# Patient Record
Sex: Male | Born: 1980 | Race: Black or African American | Hispanic: No | Marital: Single | Smoking: Never smoker
Health system: Southern US, Community
[De-identification: ages and names within clinical notes are randomized; demographics above are authoritative.]

## PROBLEM LIST (undated history)

## (undated) DIAGNOSIS — H5210 Myopia, unspecified eye: Secondary | ICD-10-CM

## (undated) DIAGNOSIS — E041 Nontoxic single thyroid nodule: Secondary | ICD-10-CM

## (undated) HISTORY — DX: Nontoxic single thyroid nodule: E04.1

## (undated) HISTORY — DX: Myopia, unspecified eye: H52.10

---

## 2008-12-19 HISTORY — PX: BURN TREATMENT: SHX158

## 2010-12-19 DIAGNOSIS — E041 Nontoxic single thyroid nodule: Secondary | ICD-10-CM

## 2010-12-19 HISTORY — DX: Nontoxic single thyroid nodule: E04.1

## 2011-04-12 ENCOUNTER — Emergency Department (HOSPITAL_COMMUNITY)
Admission: EM | Admit: 2011-04-12 | Discharge: 2011-04-13 | Disposition: A | Discharge status: Home / Self Care | Payer: Worker's Compensation | Attending: Emergency Medicine | Admitting: Emergency Medicine

## 2011-04-12 DIAGNOSIS — T24019A Burn of unspecified degree of unspecified thigh, initial encounter: Secondary | ICD-10-CM | POA: Insufficient documentation

## 2011-04-12 DIAGNOSIS — R609 Edema, unspecified: Secondary | ICD-10-CM | POA: Insufficient documentation

## 2011-04-12 DIAGNOSIS — IMO0002 Reserved for concepts with insufficient information to code with codable children: Secondary | ICD-10-CM | POA: Insufficient documentation

## 2011-04-12 DIAGNOSIS — Y929 Unspecified place or not applicable: Secondary | ICD-10-CM | POA: Insufficient documentation

## 2011-04-12 DIAGNOSIS — T24039A Burn of unspecified degree of unspecified lower leg, initial encounter: Secondary | ICD-10-CM | POA: Insufficient documentation

## 2011-04-12 DIAGNOSIS — M79609 Pain in unspecified limb: Secondary | ICD-10-CM | POA: Insufficient documentation

## 2011-04-12 DIAGNOSIS — M7989 Other specified soft tissue disorders: Secondary | ICD-10-CM | POA: Insufficient documentation

## 2011-08-05 ENCOUNTER — Encounter: Payer: Self-pay | Admitting: Medical

## 2011-10-14 ENCOUNTER — Encounter: Payer: Self-pay | Admitting: Medical

## 2011-10-14 ENCOUNTER — Ambulatory Visit (INDEPENDENT_AMBULATORY_CARE_PROVIDER_SITE_OTHER): Payer: BC Managed Care – PPO | Admitting: Medical

## 2011-10-14 ENCOUNTER — Other Ambulatory Visit: Payer: Self-pay | Admitting: Medical

## 2011-10-14 DIAGNOSIS — E041 Nontoxic single thyroid nodule: Secondary | ICD-10-CM

## 2011-10-14 DIAGNOSIS — J309 Allergic rhinitis, unspecified: Secondary | ICD-10-CM | POA: Insufficient documentation

## 2011-10-14 DIAGNOSIS — R030 Elevated blood-pressure reading, without diagnosis of hypertension: Secondary | ICD-10-CM | POA: Insufficient documentation

## 2011-10-14 DIAGNOSIS — S56911A Strain of unspecified muscles, fascia and tendons at forearm level, right arm, initial encounter: Secondary | ICD-10-CM | POA: Insufficient documentation

## 2011-10-14 DIAGNOSIS — Z0000 Encounter for general adult medical examination without abnormal findings: Secondary | ICD-10-CM
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
## 2011-10-14 NOTE — Patient Instructions (Signed)
Abnormal findings today:  You appear to have a 5mm diameter nodule on your right thyroid.   This could be a number of thing, but normally we examine this further with ultrasound and lab work.  I recommend we go ahead and get a thyroid ultrasound.    Your nasal exam suggests swelling and clear discharge from allergens.  I want you to begin OTC Zyrtec 10mg , 1 tablet daily at bedtime for nasal swelling and congestion. If this is not helping after 2-3 weeks, we can add a prescription nasal spray.   I would like you to return fasting (nothing to eat after midnight) for labs one morning next week.  Please let the front office know which day you will be coming in.   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  I recommend a few days of rest if possible.  I recommend applying ice for 20 minutes, followed by heat for 20 minutes once to twice daily for 1- 2 weeks.   You can use OTC Aleve once to twice daily for pain and inflammation.   You can also purchase a "tennis elbow" band from the pharmacy that you can wear over the forearm just before it meets the elbow.  This can give comfort.   If not improving in 2-3 weeks, let me know.   Overall, you appear healthy.     Preventative Care for Adults, Male       REGULAR HEALTH EXAMS:  A routine yearly physical is a good way to check in with your primary care provider about your health and preventive screening. It is also an opportunity to share updates about your health and any concerns you have, and receive a thorough all-over exam.   Most health insurance companies pay for at least some preventative services.  Check with your health plan for specific coverages.  WHAT PREVENTATIVE SERVICES DO MEN NEED?  Adult men should have their weight and blood pressure checked regularly.   Men age 60 and older should have their cholesterol levels checked regularly.  Beginning at age 83 and continuing to  age 72, men should be screened for colorectal cancer.  Certain people should may need continued testing until age 38.  Other cancer screening may include exams for testicular and prostate cancer.  Updating vaccinations is part of preventative care.  Vaccinations help protect against diseases such as the flu.  Lab tests are generally done as part of preventative care to screen for anemia and blood disorders, to screen for problems with the kidneys and liver, to screen for bladder problems, to check blood sugar, and to check your cholesterol level.  Preventative services generally include counseling about diet, exercise, avoiding tobacco, drugs, excessive alcohol consumption, and sexually transmitted infections.    GENERAL RECOMMENDATIONS FOR GOOD HEALTH:  Healthy diet:  Eat a variety of foods, including fruit, vegetables, animal or vegetable protein, such as meat, fish, chicken, and eggs, or beans, lentils, tofu, and grains, such as rice.  Drink plenty of water daily.  Decrease saturated fat in the diet, avoid lots of red meat, processed foods, sweets, fast foods, and fried foods.  Exercise:  Aerobic exercise helps maintain good heart health. At least 30-40 minutes of moderate-intensity exercise is recommended. For example, a brisk walk that increases your heart rate and breathing. This should be done on most days of the week.   Find a type of exercise or a variety  of exercises that you enjoy so that it becomes a part of your daily life.  Examples are running, walking, swimming, water aerobics, and biking.  For motivation and support, explore group exercise such as aerobic class, spin class, Zumba, Yoga,or  martial arts, etc.    Set exercise goals for yourself, such as a certain weight goal, walk or run in a race such as a 5k walk/run.  Speak to your primary care provider about exercise goals.  Disease prevention:  If you smoke or chew tobacco, find out from your caregiver how to quit.  It can literally save your life, no matter how long you have been a tobacco user. If you do not use tobacco, never begin.   Maintain a healthy diet and normal weight. Increased weight leads to problems with blood pressure and diabetes.   The Body Mass Index or BMI is a way of measuring how much of your body is fat. Having a BMI above 27 increases the risk of heart disease, diabetes, hypertension, stroke and other problems related to obesity. Your caregiver can help determine your BMI and based on it develop an exercise and dietary program to help you achieve or maintain this important measurement at a healthful level.  High blood pressure causes heart and blood vessel problems.  Persistent high blood pressure should be treated with medicine if weight loss and exercise do not work.   Fat and cholesterol leaves deposits in your arteries that can block them. This causes heart disease and vessel disease elsewhere in your body.  If your cholesterol is found to be high, or if you have heart disease or certain other medical conditions, then you may need to have your cholesterol monitored frequently and be treated with medication.   Ask if you should have a stress test if your history suggests this. A stress test is a test done on a treadmill that looks for heart disease. This test can find disease prior to there being a problem.  Avoid drinking alcohol in excess (more than two drinks per day).  Avoid use of street drugs. Do not share needles with anyone. Ask for professional help if you need assistance or instructions on stopping the use of alcohol, cigarettes, and/or drugs.  Brush your teeth twice a day with fluoride toothpaste, and floss once a day. Good oral hygiene prevents tooth decay and gum disease. The problems can be painful, unattractive, and can cause other health problems. Visit your dentist for a routine oral and dental check up and preventive care every 6-12 months.   Look at your skin  regularly.  Use a mirror to look at your back. Notify your caregivers of changes in moles, especially if there are changes in shapes, colors, a size larger than a pencil eraser, an irregular border, or development of new moles.  Safety:  Use seatbelts 100% of the time, whether driving or as a passenger.  Use safety devices such as hearing protection if you work in environments with loud noise or significant background noise.  Use safety glasses when doing any work that could send debris in to the eyes.  Use a helmet if you ride a bike or motorcycle.  Use appropriate safety gear for contact sports.  Talk to your caregiver about gun safety.  Use sunscreen with a SPF (or skin protection factor) of 15 or greater.  Lighter skinned people are at a greater risk of skin cancer. Don't forget to also wear sunglasses in order to protect your eyes  from too much damaging sunlight. Damaging sunlight can accelerate cataract formation.   Practice safe sex. Use condoms. Condoms are used for birth control and to help reduce the spread of sexually transmitted infections (or STIs).  Some of the STIs are gonorrhea (the clap), chlamydia, syphilis, trichomonas, herpes, HPV (human papilloma virus) and HIV (human immunodeficiency virus) which causes AIDS. The herpes, HIV and HPV are viral illnesses that have no cure. These can result in disability, cancer and death.   Keep carbon monoxide and smoke detectors in your home functioning at all times. Change the batteries every 6 months or use a model that plugs into the wall.   Vaccinations:  Stay up to date with your tetanus shots and other required immunizations. You should have a booster for tetanus every 10 years. Be sure to get your flu shot every year, since 5%-20% of the [place] population comes down with the flu. The flu vaccine changes each year, so being vaccinated once is not enough. Get your shot in the fall, before the flu season peaks.   Other vaccines to  consider:  Pneumococcal vaccine to protect against certain types of pneumonia.  This is normally recommended for adults age 79 or older.  However, adults younger than 28 years old with certain underlying conditions such as diabetes, heart or lung disease should also receive the vaccine.  Shingles vaccine to protect against Varicella Zoster if you are older than age 4, or younger than 28 years old with certain underlying illness.  Hepatitis A vaccine to protect against a form of infection of the liver by a virus acquired from food.  Hepatitis B vaccine to protect against a form of infection of the liver by a virus acquired from blood or body fluids, particularly if you work in health care.  If you plan to travel internationally, check with your local health department for specific vaccination recommendations.  Cancer Screening:  Most routine colon cancer screening begins at the age of 56. On a yearly basis, doctors may provide special easy to use take-home tests to check for hidden blood in the stool. Sigmoidoscopy or colonoscopy can detect the earliest forms of colon cancer and is life saving. These tests use a small camera at the end of a tube to directly examine the colon. Speak to your caregiver about this at age 11, when routine screening begins (and is repeated every 5 years unless early forms of pre-cancerous polyps or small growths are found).   At the age of 14 men usually start screening for prostate cancer every year. Screening may begin at a younger age for those with higher risk. Those at higher risk include African-Americans or having a family history of prostate cancer. There are two types of tests for prostate cancer:   Prostate-specific antigen (PSA) testing. Recent studies raise questions about prostate cancer using PSA and you should discuss this with your caregiver.   Digital rectal exam (in which your doctor's lubricated and gloved finger feels for enlargement of the prostate  through the anus).   Screening for testicular cancer.  Do a monthly exam of your testicles. Gently roll each testicle between your thumb and fingers, feeling for any abnormal lumps. The best time to do this is after a hot shower or bath when the tissues are looser. Notify your caregivers of any lumps, tenderness or changes in size or shape immediately.

## 2011-10-17 ENCOUNTER — Telehealth: Payer: Self-pay | Admitting: Family Medicine

## 2011-10-17 NOTE — Telephone Encounter (Signed)
This patient was referred to [facility] 10/19/11 @ 1045am. CLS

## 2011-10-17 NOTE — Telephone Encounter (Signed)
Message copied by Janeice Robinson on Mon Oct 17, 2011  8:10 AM ------      Message from: [place], [place]      Created: Fri Oct 14, 2011  6:09 PM       Order thyroid ultrasound.

## 2011-10-19 ENCOUNTER — Ambulatory Visit
Admission: RE | Admit: 2011-10-19 | Discharge: 2011-10-19 | Disposition: A | Discharge status: Home / Self Care | Payer: Self-pay | Source: Ambulatory Visit | Attending: Medical | Admitting: Medical

## 2011-10-19 ENCOUNTER — Other Ambulatory Visit: Payer: Self-pay

## 2011-10-19 DIAGNOSIS — E041 Nontoxic single thyroid nodule: Secondary | ICD-10-CM

## 2011-10-20 ENCOUNTER — Other Ambulatory Visit: Payer: Self-pay | Admitting: Medical

## 2011-10-20 DIAGNOSIS — E041 Nontoxic single thyroid nodule: Secondary | ICD-10-CM

## 2011-10-20 LAB — CBC WITH DIFFERENTIAL/PLATELET
Basophils Relative: 1 % (ref 0–1)
Eosinophils Absolute: 0.5 10*3/uL (ref 0.0–0.7)
Eosinophils Relative: 9 % — ABNORMAL HIGH (ref 0–5)
Hemoglobin: 13.7 g/dL (ref 13.0–17.0)
Lymphs Abs: 2.1 10*3/uL (ref 0.7–4.0)
MCH: 30.9 pg (ref 26.0–34.0)
MCHC: 33.9 g/dL (ref 30.0–36.0)
MCV: 91 fL (ref 78.0–100.0)
Monocytes Absolute: 0.2 10*3/uL (ref 0.1–1.0)
Monocytes Relative: 3 % (ref 3–12)
Neutrophils Relative %: 47 % (ref 43–77)
RBC: 4.44 MIL/uL (ref 4.22–5.81)

## 2011-10-20 LAB — COMPREHENSIVE METABOLIC PANEL
AST: 30 U/L (ref 0–37)
Albumin: 4.3 g/dL (ref 3.5–5.2)
Alkaline Phosphatase: 58 U/L (ref 39–117)
BUN: 14 mg/dL (ref 6–23)
Glucose, Bld: 90 mg/dL (ref 70–99)
Potassium: 3.8 mEq/L (ref 3.5–5.3)
Sodium: 138 mEq/L (ref 135–145)
Total Bilirubin: 0.8 mg/dL (ref 0.3–1.2)

## 2011-10-20 LAB — LIPID PANEL
HDL: 48 mg/dL (ref >39)
LDL Cholesterol: 130 mg/dL — ABNORMAL HIGH (ref 0–99)
Total CHOL/HDL Ratio: 3.9 Ratio
VLDL: 9 mg/dL (ref 0–40)

## 2011-10-20 LAB — TSH: TSH: 1.003 u[IU]/mL (ref 0.350–4.500)

## 2011-10-20 NOTE — Progress Notes (Signed)
LM TO CB. CLS 

## 2011-10-25 ENCOUNTER — Inpatient Hospital Stay: Admission: RE | Admit: 2011-10-25 | Payer: Self-pay | Source: Ambulatory Visit

## 2011-11-01 ENCOUNTER — Inpatient Hospital Stay: Admission: RE | Admit: 2011-11-01 | Payer: Self-pay | Source: Ambulatory Visit

## 2012-05-19 HISTORY — PX: BIOPSY THYROID: PRO38

## 2012-05-23 ENCOUNTER — Telehealth: Payer: Self-pay | Admitting: Medical

## 2012-05-23 NOTE — Telephone Encounter (Signed)
pls make the referral.  Lets stay on top of this since he didn't f/u right away in October

## 2012-05-24 NOTE — Telephone Encounter (Signed)
PATIENT WAS MADE AWARE OF HIS APPOINTMENT ON 05/24/12 @ 158 PM. CLS [facility]. 05/30/12 @ 845 AM. CLS

## 2012-05-29 ENCOUNTER — Other Ambulatory Visit: Payer: Self-pay | Admitting: Medical

## 2012-05-29 DIAGNOSIS — E041 Nontoxic single thyroid nodule: Secondary | ICD-10-CM

## 2012-05-30 ENCOUNTER — Other Ambulatory Visit (HOSPITAL_COMMUNITY)
Admission: RE | Admit: 2012-05-30 | Discharge: 2012-05-30 | Disposition: A | Discharge status: Home / Self Care | Payer: BC Managed Care – PPO | Source: Ambulatory Visit | Attending: Diagnostic Radiology | Admitting: Diagnostic Radiology

## 2012-05-30 ENCOUNTER — Inpatient Hospital Stay
Admission: RE | Admit: 2012-05-30 | Discharge: 2012-05-30 | Payer: Self-pay | Source: Ambulatory Visit | Attending: Medical | Admitting: Medical

## 2012-05-30 ENCOUNTER — Other Ambulatory Visit: Payer: Self-pay

## 2012-05-30 ENCOUNTER — Ambulatory Visit
Admission: RE | Admit: 2012-05-30 | Discharge: 2012-05-30 | Disposition: A | Discharge status: Home / Self Care | Payer: BC Managed Care – PPO | Source: Ambulatory Visit | Attending: Medical | Admitting: Medical

## 2012-05-30 DIAGNOSIS — E049 Nontoxic goiter, unspecified: Secondary | ICD-10-CM | POA: Insufficient documentation

## 2012-05-30 DIAGNOSIS — E041 Nontoxic single thyroid nodule: Secondary | ICD-10-CM

## 2012-06-11 ENCOUNTER — Encounter: Payer: Self-pay | Admitting: Medical

## 2012-06-11 ENCOUNTER — Ambulatory Visit (INDEPENDENT_AMBULATORY_CARE_PROVIDER_SITE_OTHER): Payer: BC Managed Care – PPO | Admitting: Medical

## 2012-06-11 VITALS — BP 110/70 | HR 75 | Temp 97.7°F | Resp 16 | Wt 167.0 lb

## 2012-06-11 DIAGNOSIS — J309 Allergic rhinitis, unspecified: Secondary | ICD-10-CM

## 2012-06-11 DIAGNOSIS — E041 Nontoxic single thyroid nodule: Secondary | ICD-10-CM

## 2012-06-11 NOTE — Progress Notes (Signed)
Subjective:   HPI  Billy Everett is a 31 y.o. male who presents for f/u on thyroid FNA and thyroid nodule.  I saw him last fall where he had thyroid nodule on physical.  He didn't initially go for biopsy, but now that he has had biopsy recently, he is here to discus results.    His only c/o today is allergies - runny nose, congestion, sneezing all day.  Using nothing. Tried Nasonex sample but this didn't help. Tried OTC Zyrtec with no relief.  No other aggravating or relieving factors.    No other c/o.  The following portions of the patient's history were reviewed and updated as appropriate: allergies, current medications, past family history, past medical history, past social history and past surgical history.  Past Medical History  Diagnosis Date  . Nearsightedness     wears contact lenses  . Chronic headache     No Known Allergies   Review of Systems ROS reviewed and was negative other than noted in HPI or above.    Objective:   Physical Exam  General appearance: alert, no distress, WD/WN HEENT: normocephalic, sclerae anicteric, nares with turbinate edema and clear discharge, TMs pearly, no discharge or erythema, pharynx normal Oral cavity: MMM, no lesions Neck: supple, no lymphadenopathy, no thyromegaly, right thyroid with small 4-5 mm palpable nodule today Heart: RRR, normal S1, S2, no murmurs   Assessment and Plan :     Encounter Diagnoses  Name Primary?  . Thyroid nodule Yes  . Allergic rhinitis    Discussed abnormal FNA results. Referral to Dr. Annie Paras surgery.  Allergic rhinitis - begin samples of Astepro and Allegra OTC

## 2012-06-11 NOTE — Patient Instructions (Addendum)
Begin OTC Claritin or Allegra, once daily at bedtime for allergies.  Try Astepro nasal spray, 1-2 sprays once to twice daily for nasal congestion.  We will call you regarding the surgery appointment to see Dr. Georgana Curio.

## 2012-07-10 ENCOUNTER — Ambulatory Visit (INDEPENDENT_AMBULATORY_CARE_PROVIDER_SITE_OTHER): Payer: BC Managed Care – PPO | Admitting: Surgery

## 2013-10-21 ENCOUNTER — Encounter: Payer: Self-pay | Admitting: Medical

## 2013-11-20 ENCOUNTER — Encounter: Payer: Self-pay | Admitting: Medical

## 2013-12-23 ENCOUNTER — Encounter: Payer: Self-pay | Admitting: Medical

## 2014-01-13 ENCOUNTER — Encounter: Payer: Self-pay | Admitting: Medical

## 2014-03-12 ENCOUNTER — Encounter: Payer: Self-pay | Admitting: Medical

## 2014-04-04 ENCOUNTER — Encounter: Payer: Self-pay | Admitting: Medical

## 2014-04-16 ENCOUNTER — Ambulatory Visit (INDEPENDENT_AMBULATORY_CARE_PROVIDER_SITE_OTHER): Payer: BC Managed Care – PPO | Admitting: Medical

## 2014-04-16 ENCOUNTER — Telehealth: Payer: Self-pay | Admitting: Family Medicine

## 2014-04-16 ENCOUNTER — Encounter: Payer: Self-pay | Admitting: Medical

## 2014-04-16 VITALS — BP 120/80 | HR 90 | Temp 98.1°F | Resp 16 | Ht 69.0 in | Wt 173.0 lb

## 2014-04-16 DIAGNOSIS — Z0000 Encounter for general adult medical examination without abnormal findings: Secondary | ICD-10-CM

## 2014-04-16 DIAGNOSIS — R0981 Nasal congestion: Secondary | ICD-10-CM

## 2014-04-16 DIAGNOSIS — J3489 Other specified disorders of nose and nasal sinuses: Secondary | ICD-10-CM

## 2014-04-16 DIAGNOSIS — E041 Nontoxic single thyroid nodule: Secondary | ICD-10-CM

## 2014-04-16 DIAGNOSIS — Z23 Encounter for immunization: Secondary | ICD-10-CM

## 2014-04-16 DIAGNOSIS — M25579 Pain in unspecified ankle and joints of unspecified foot: Secondary | ICD-10-CM

## 2014-04-16 LAB — POCT URINALYSIS DIPSTICK
Bilirubin, UA: NEGATIVE
Glucose, UA: NEGATIVE
Ketones, UA: NEGATIVE
LEUKOCYTES UA: NEGATIVE
NEG CONTROL: NEGATIVE
NITRITE UA: NEGATIVE
PH UA: 5
PROTEIN UA: NEGATIVE
Spec Grav, UA: 1.015
Urobilinogen, UA: NEGATIVE

## 2014-04-16 LAB — CBC WITH DIFFERENTIAL/PLATELET
BASOS ABS: 0.1 10*3/uL (ref 0.0–0.1)
BASOS PCT: 1 % (ref 0–1)
EOS ABS: 0.3 10*3/uL (ref 0.0–0.7)
Eosinophils Relative: 6 % — ABNORMAL HIGH (ref 0–5)
HCT: 42.1 % (ref 39.0–52.0)
Hemoglobin: 14.9 g/dL (ref 13.0–17.0)
Lymphocytes Relative: 35 % (ref 12–46)
Lymphs Abs: 1.9 10*3/uL (ref 0.7–4.0)
MCH: 31.3 pg (ref 26.0–34.0)
MCHC: 35.4 g/dL (ref 30.0–36.0)
MCV: 88.4 fL (ref 78.0–100.0)
Monocytes Absolute: 0.2 10*3/uL (ref 0.1–1.0)
Monocytes Relative: 4 % (ref 3–12)
NEUTROS PCT: 54 % (ref 43–77)
Neutro Abs: 2.9 10*3/uL (ref 1.7–7.7)
PLATELETS: 286 10*3/uL (ref 150–400)
RBC: 4.76 MIL/uL (ref 4.22–5.81)
RDW: 13.1 % (ref 11.5–15.5)
WBC: 5.3 10*3/uL (ref 4.0–10.5)

## 2014-04-16 LAB — BASIC METABOLIC PANEL
BUN: 12 mg/dL (ref 6–23)
CALCIUM: 9 mg/dL (ref 8.4–10.5)
CO2: 29 mEq/L (ref 19–32)
Chloride: 103 mEq/L (ref 96–112)
Creat: 0.91 mg/dL (ref 0.50–1.35)
GLUCOSE: 107 mg/dL — AB (ref 70–99)
Potassium: 4.1 mEq/L (ref 3.5–5.3)
SODIUM: 140 meq/L (ref 135–145)

## 2014-04-16 MED ORDER — AZELASTINE-FLUTICASONE 137-50 MCG/ACT NA SUSP: 1.0000 | Freq: Two times a day (BID) | NASAL | Status: DC

## 2014-04-16 NOTE — Telephone Encounter (Signed)
Patient is aware of his appointment [facility] on Apr 18, 2014 @ 1045 am for a thyroid Ultrasound.[phone] CLS
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
## 2014-04-16 NOTE — Patient Instructions (Signed)
  Thank you for giving me the opportunity to serve you today.    Your diagnosis today includes: Encounter Diagnoses  Name Primary?  . Routine general medical examination at a health care facility Yes  . Thyroid nodule   . Nasal congestion   . Pain in joint, ankle and foot   . Need for Tdap vaccination      Specific recommendations today include:  Begin trial of Dymista nasal spray, 1 spray per nostril twice daily.   If not improving in 3-4 week we can refer you to ear nose and throat  See dentist  We will get an x-ray of her left foot and ankle  We will send you for repeat ultrasound of the thyroid for surveillance  Return pending studies.

## 2014-04-17 ENCOUNTER — Telehealth: Payer: Self-pay

## 2014-04-17 LAB — T4, FREE: FREE T4: 1.31 ng/dL (ref 0.80–1.80)

## 2014-04-17 LAB — TSH: TSH: 0.826 u[IU]/mL (ref 0.350–4.500)

## 2014-04-17 NOTE — Telephone Encounter (Signed)
Message copied by Lilli LightLOMAX, WENDY G on Thu Apr 17, 2014  1:28 PM ------      Message from: Jac CanavanYSINGER, DAVID S      Created: Thu Apr 17, 2014  7:57 AM       Labs ok except glucose a little elevated.  Need to be careful with diet, avoiding lots of sweets, sweet tea, soda, etc.   Lets repeat fasting morning glucose nurse visit in 2-3 mo.              Proceed as planned for xrays and thyroid ultrasound. ------

## 2014-04-17 NOTE — Telephone Encounter (Signed)
LM to CB

## 2014-04-18 ENCOUNTER — Other Ambulatory Visit: Payer: BC Managed Care – PPO

## 2014-04-24 NOTE — Progress Notes (Signed)
Labs reported to patient, follow up appointment scheduled for 06/24/14 @ 9:15, fasting to recheck blood glucose level

## 2014-04-24 NOTE — Telephone Encounter (Signed)
Advised patient of Fasting Blood Sugar appointment 06/24/14 @ 9:15.

## 2014-05-08 ENCOUNTER — Other Ambulatory Visit: Payer: BC Managed Care – PPO

## 2014-05-09 ENCOUNTER — Other Ambulatory Visit: Payer: BC Managed Care – PPO

## 2014-05-16 ENCOUNTER — Other Ambulatory Visit: Payer: BC Managed Care – PPO

## 2014-05-30 ENCOUNTER — Ambulatory Visit
Admission: RE | Admit: 2014-05-30 | Discharge: 2014-05-30 | Disposition: A | Discharge status: Home / Self Care | Payer: BC Managed Care – PPO | Source: Ambulatory Visit | Attending: Medical | Admitting: Medical

## 2014-05-30 DIAGNOSIS — E041 Nontoxic single thyroid nodule: Secondary | ICD-10-CM

## 2014-06-02 ENCOUNTER — Other Ambulatory Visit: Payer: Self-pay | Admitting: Medical

## 2014-06-02 DIAGNOSIS — E041 Nontoxic single thyroid nodule: Secondary | ICD-10-CM

## 2014-06-11 ENCOUNTER — Inpatient Hospital Stay: Admission: RE | Admit: 2014-06-11 | Payer: BC Managed Care – PPO | Source: Ambulatory Visit

## 2014-06-24 ENCOUNTER — Other Ambulatory Visit (INDEPENDENT_AMBULATORY_CARE_PROVIDER_SITE_OTHER): Payer: BC Managed Care – PPO

## 2014-06-24 DIAGNOSIS — R7309 Other abnormal glucose: Secondary | ICD-10-CM

## 2014-06-24 LAB — POCT GLYCOSYLATED HEMOGLOBIN (HGB A1C): HEMOGLOBIN A1C: 4.6

## 2014-06-24 LAB — GLUCOSE, POCT (MANUAL RESULT ENTRY): POC GLUCOSE: 156 mg/dL — AB (ref 70–99)

## 2014-06-25 ENCOUNTER — Inpatient Hospital Stay: Admission: RE | Admit: 2014-06-25 | Payer: BC Managed Care – PPO | Source: Ambulatory Visit

## 2014-06-26 ENCOUNTER — Telehealth: Payer: Self-pay | Admitting: Medical

## 2014-06-26 NOTE — Telephone Encounter (Signed)
pls call and find out what the story is?  Not appropriate to no show.

## 2014-06-27 NOTE — Telephone Encounter (Signed)
I called over to [facility] to check on his next appointment . CLS

## 2014-06-27 NOTE — Telephone Encounter (Signed)
Patient states that he forgot about his appointment. CLS I explain to him that he needs to make sure he shows up for his next appointment. CLS

## 2014-06-27 NOTE — Telephone Encounter (Signed)
I spoke with Tammy and she states that she will reschedule his appointment one more time but if he no shows they will not reschedule him anymore . CLS

## 2015-11-17 ENCOUNTER — Encounter: Payer: Self-pay | Admitting: Family Medicine

## 2015-11-17 ENCOUNTER — Ambulatory Visit (INDEPENDENT_AMBULATORY_CARE_PROVIDER_SITE_OTHER): Payer: BLUE CROSS/BLUE SHIELD | Admitting: Family Medicine

## 2015-11-17 ENCOUNTER — Other Ambulatory Visit: Payer: Self-pay | Admitting: Family Medicine

## 2015-11-17 ENCOUNTER — Other Ambulatory Visit: Payer: Self-pay

## 2015-11-17 VITALS — BP 112/70 | HR 90 | Temp 97.9°F | Ht 69.0 in | Wt 184.0 lb

## 2015-11-17 DIAGNOSIS — R4702 Dysphasia: Secondary | ICD-10-CM

## 2015-11-17 DIAGNOSIS — R0981 Nasal congestion: Secondary | ICD-10-CM

## 2015-11-17 DIAGNOSIS — E041 Nontoxic single thyroid nodule: Secondary | ICD-10-CM

## 2015-11-17 LAB — POCT RAPID STREP A (OFFICE): RAPID STREP A SCREEN: NEGATIVE

## 2015-11-17 MED ORDER — BUDESONIDE 32 MCG/ACT NA SUSP: 2.0000 | Freq: Every day | NASAL | Status: DC

## 2015-11-17 NOTE — Progress Notes (Signed)
   Subjective:    Patient ID: Billy Everett, male    DOB: 01/16/1981, 34 y.o.   MRN: [id number]  HPI he complains of a three-day history of pain with swallowing but not truly a sore throat. No earache, cough, congestion. He does continue to complain of nasal congestion and says that the diagnosis to get until last year did not work. He also has a history of thyroid nodule scheduled for a follow-up biopsy but did not follow through on this.  Review of Systems     Objective:   Physical Exam Alert and in no distress. Tympanic membranes and canals are normal. Pharyngeal area is normal. Neck is supple without adenopathy or thyromegaly. Cardiac exam shows a regular sinus rhythm without murmurs or gallops. Lungs are clear to auscultation. Screen negative      Assessment & Plan:  Dysphasia - Plan: Rapid Strep A  Nasal congestion - Plan: budesonide (RHINOCORT AQUA) 32 MCG/ACT nasal spray  Thyroid nodule - Plan: US Thyroid Biopsy  recommend supportive care for the dysphasia and if continued difficulty, we will pursue this further. Also use the nasal steroid spray for at least 3 weeks and if no improvement probable ENT referral. He is being set up for the biopsy.

## 2015-11-17 NOTE — Patient Instructions (Signed)
Use around the cord daily for the next 3 weeks and if no improvement call for referral

## 2015-11-20 ENCOUNTER — Ambulatory Visit
Admission: RE | Admit: 2015-11-20 | Discharge: 2015-11-20 | Disposition: A | Discharge status: Home / Self Care | Payer: BLUE CROSS/BLUE SHIELD | Source: Ambulatory Visit | Attending: Family Medicine | Admitting: Family Medicine

## 2015-11-20 DIAGNOSIS — E041 Nontoxic single thyroid nodule: Secondary | ICD-10-CM

## 2015-11-26 ENCOUNTER — Other Ambulatory Visit (HOSPITAL_COMMUNITY)
Admission: RE | Admit: 2015-11-26 | Discharge: 2015-11-26 | Disposition: A | Discharge status: Home / Self Care | Payer: BLUE CROSS/BLUE SHIELD | Source: Ambulatory Visit | Attending: Family Medicine | Admitting: Family Medicine

## 2015-11-26 ENCOUNTER — Ambulatory Visit
Admission: RE | Admit: 2015-11-26 | Discharge: 2015-11-26 | Disposition: A | Discharge status: Home / Self Care | Payer: BLUE CROSS/BLUE SHIELD | Source: Ambulatory Visit | Attending: Family Medicine | Admitting: Family Medicine

## 2015-11-26 ENCOUNTER — Other Ambulatory Visit: Payer: Self-pay | Admitting: Family Medicine

## 2015-11-26 DIAGNOSIS — E041 Nontoxic single thyroid nodule: Secondary | ICD-10-CM | POA: Insufficient documentation

## 2015-12-09 ENCOUNTER — Encounter (HOSPITAL_COMMUNITY): Payer: Self-pay

## 2015-12-17 IMAGING — US US SOFT TISSUE HEAD/NECK
1 series · 14 of 25 positions shown · non-contrast
Comparison: 10/19/2011

CLINICAL DATA: Thyroid nodule

EXAM:
THYROID ULTRASOUND
TECHNIQUE: Ultrasound examination of the thyroid gland and adjacent soft
tissues was performed.

[Series 1: us soft tissue head/neck · 0.09mm/px · 14 of 40 slices shown]
[im 1/40]
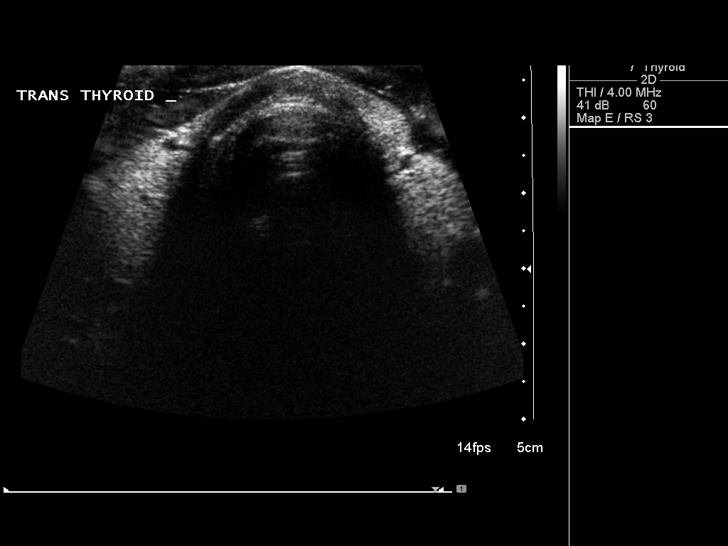
[im 4/40]
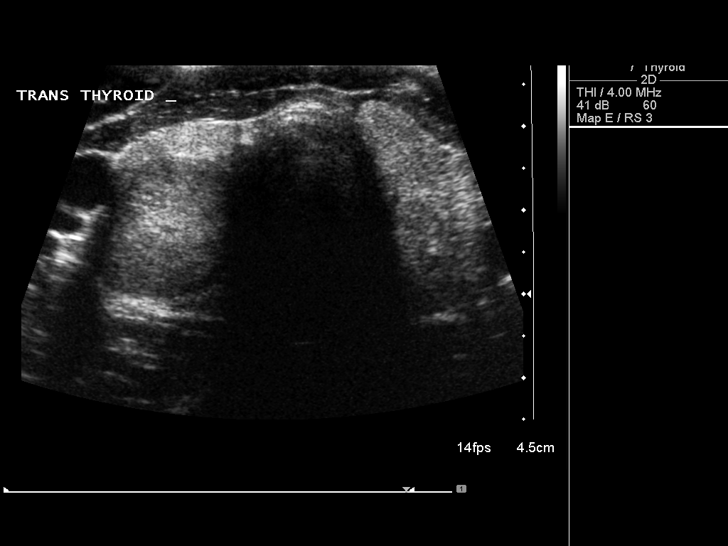
[im 7/40]
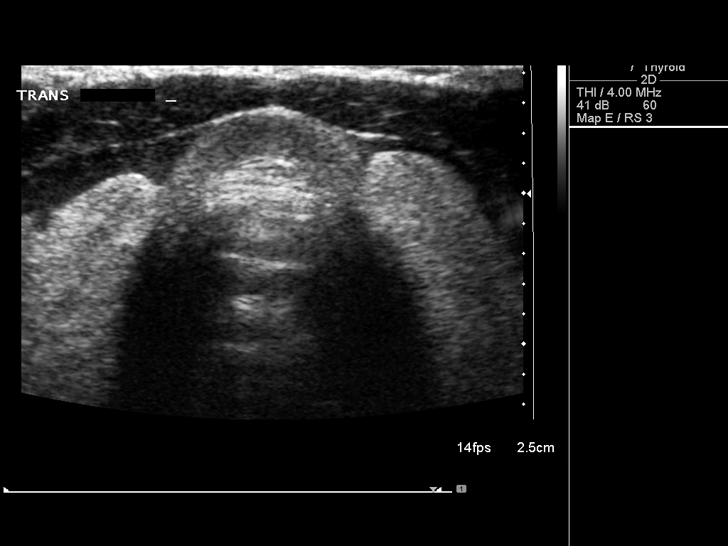
[im 10/40]
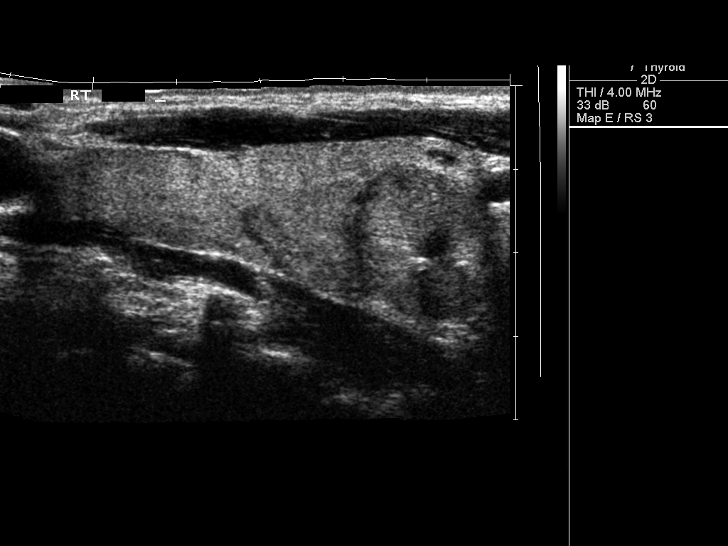
[im 14/40]
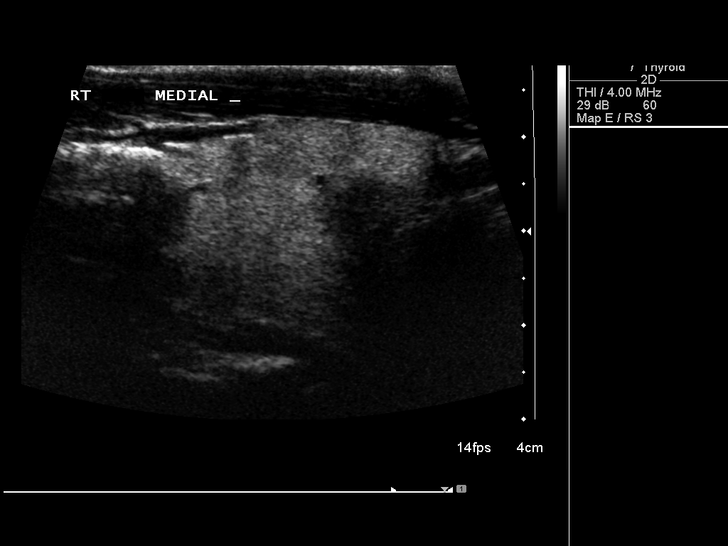
[im 15/40]
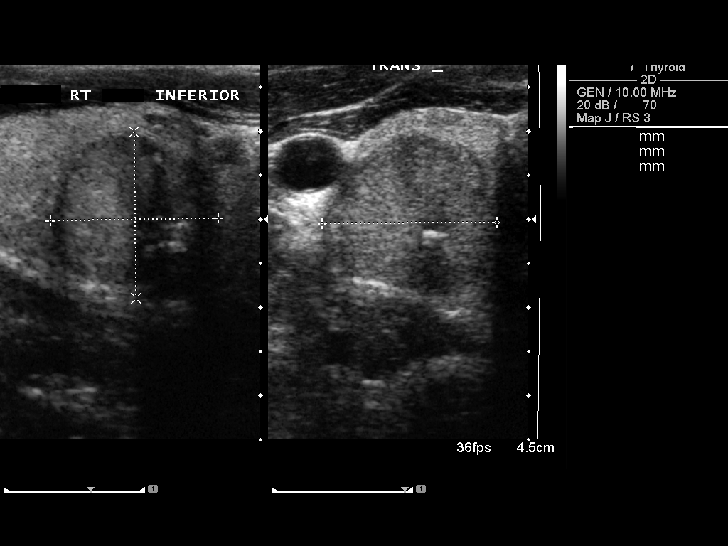
[im 18/40]
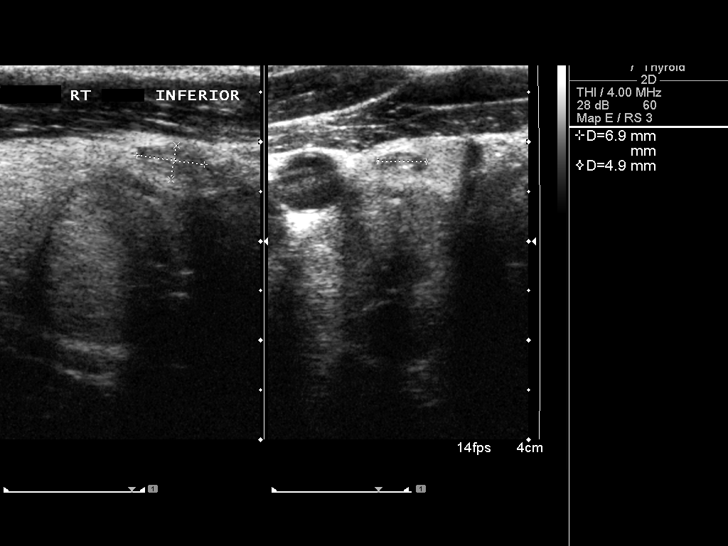
[im 22/40]
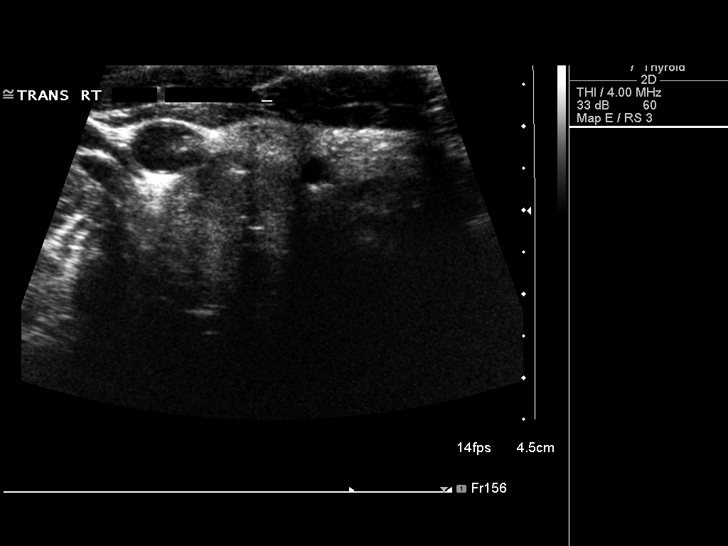
[im 25/40]
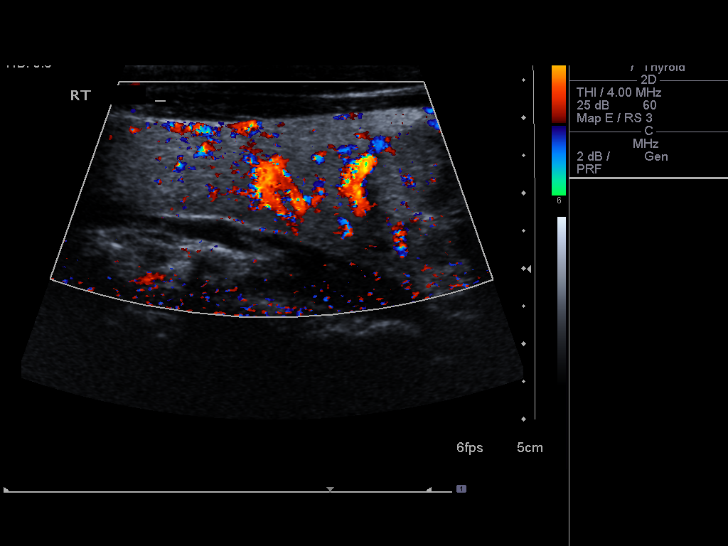
[im 27/40]
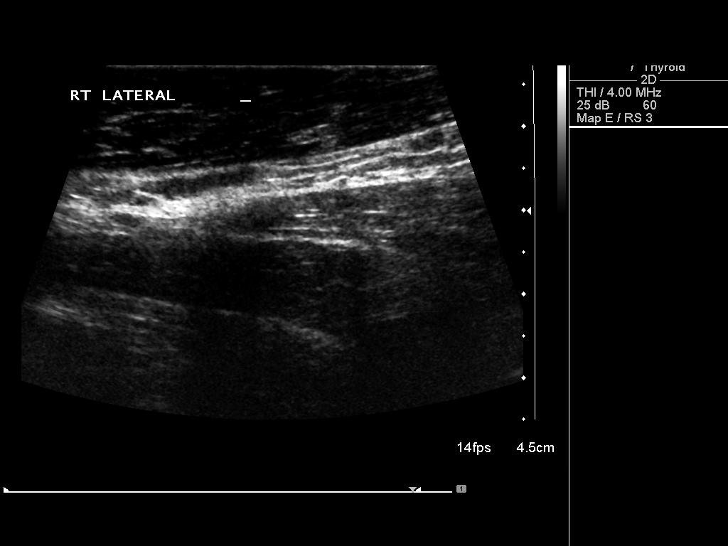
[im 30/40]
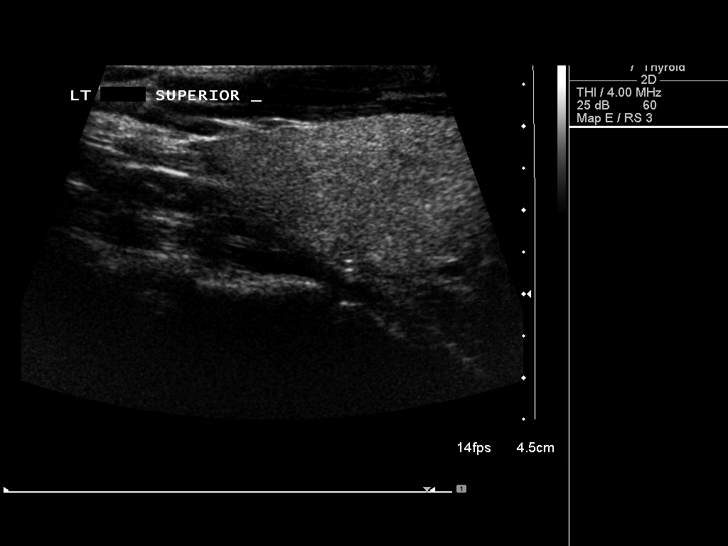
[im 33/40]
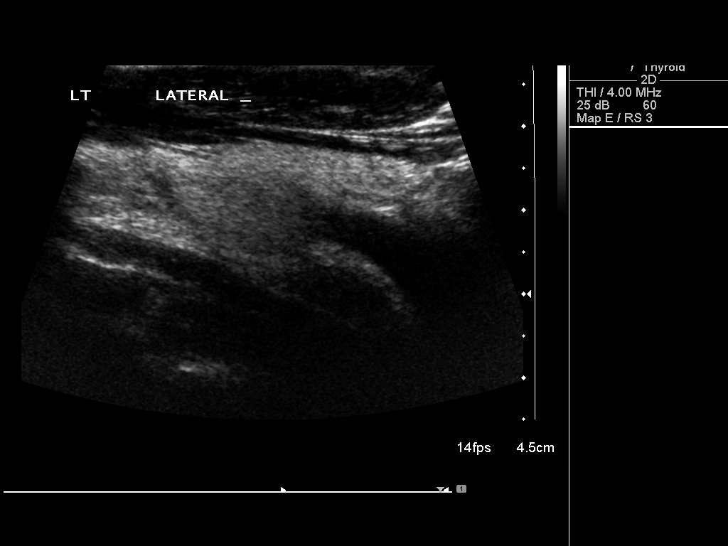
[im 36/40]
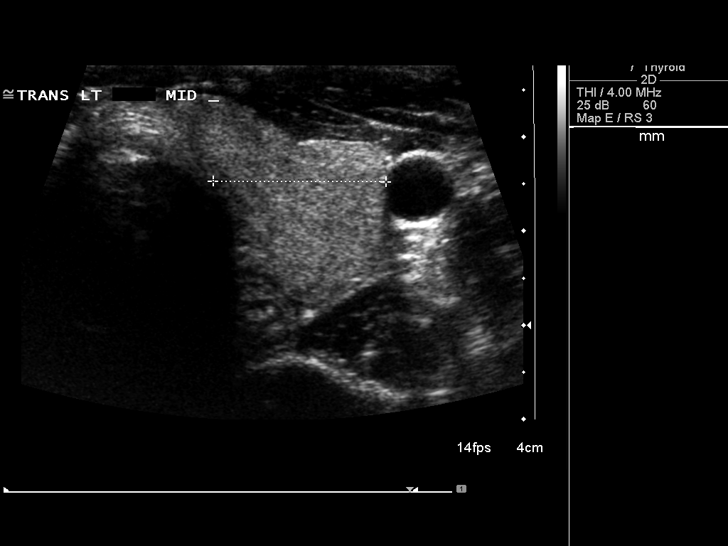
[im 40/40]
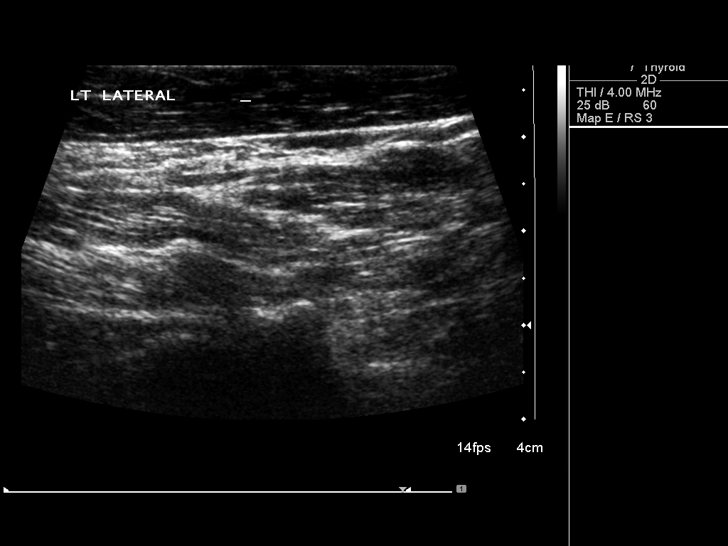

[14 of 25 positions shown; findings below may reference images not displayed]

FINDINGS: Right thyroid lobe

Measurements: 5.5 x 2.2 x 2.3 cm. 1.9 x 1.9 x 2.0 cm right lower
pole nodule with calcifications. Previously, this measured 1.5 x
x 1.8 cm. Sub cm adjacent complex nodule.

Left thyroid lobe

Measurements: 4.4 x 2.0 x 1.8 cm.  No nodules visualized.

Isthmus

Thickness: 3 mm.  No nodules visualized.

Lymphadenopathy

None visualized.
IMPRESSION: Dominant nodule in the right lower pole has enlarged. Findings meet
consensus criteria for biopsy. Ultrasound-guided fine needle
aspiration should be considered, as per the consensus statement:
Management of Thyroid Nodules Detected at US: Society of
Radiologists in Ultrasound Consensus Conference Statement. Radiology

## 2016-06-14 ENCOUNTER — Ambulatory Visit: Payer: BLUE CROSS/BLUE SHIELD | Admitting: Medical

## 2016-06-17 ENCOUNTER — Ambulatory Visit (INDEPENDENT_AMBULATORY_CARE_PROVIDER_SITE_OTHER): Payer: BLUE CROSS/BLUE SHIELD | Admitting: Medical

## 2016-06-17 ENCOUNTER — Ambulatory Visit
Admission: RE | Admit: 2016-06-17 | Discharge: 2016-06-17 | Disposition: A | Discharge status: Home / Self Care | Payer: BLUE CROSS/BLUE SHIELD | Source: Ambulatory Visit | Attending: Medical | Admitting: Medical

## 2016-06-17 ENCOUNTER — Encounter: Payer: Self-pay | Admitting: Medical

## 2016-06-17 VITALS — BP 110/78 | HR 68 | Wt 169.0 lb

## 2016-06-17 DIAGNOSIS — Z7729 Contact with and (suspected ) exposure to other hazardous substances: Secondary | ICD-10-CM

## 2016-06-17 DIAGNOSIS — J343 Hypertrophy of nasal turbinates: Secondary | ICD-10-CM | POA: Diagnosis not present

## 2016-06-17 DIAGNOSIS — R0981 Nasal congestion: Secondary | ICD-10-CM

## 2016-06-17 DIAGNOSIS — R0600 Dyspnea, unspecified: Secondary | ICD-10-CM

## 2016-06-17 NOTE — Progress Notes (Signed)
Subjective: Chief Complaint  Patient presents with  . Nasal Congestion    states it has been this way for years. said that nasal spray does not work. wants to have x rays. works with a lot of dust   Here for chronic issues with nostrils, can't breath out nostrils.  Has dealt with this for 5-6 years.  Has tried Rhinocort and Astelin and daystar, nothing works.  Can't breath at night out nostrils when he lies down to sleep.   Gets some sneezing.  No watery or itchy eyes.   Denies drainage, sore throat.  No cough.    He is worried about his lungs.   Works in dusty environment, worries about his breathing long term in working environment.  No hx/o asthma.  Nonsmoker.  Works at [employer], works around [employer] dust all the time, works a Engineer, materialstumbler, very dusty.  Wears N95 mask at work but does have facial hair.  Been there 13 years.   No wheezing, SOB.  Wants screening.   No other aggravating or relieving factors. No other complaint.  Past Medical History  Diagnosis Date  . Nearsightedness     wears contact lenses  . Chronic headache     remote past; resolved as of 2014  . Thyroid nodule 2012    biopsy indeterminate   No current outpatient prescriptions on file prior to visit.   No current facility-administered medications on file prior to visit.   ROS as in subjective   Objective: BP 110/78 mmHg  Pulse 68  Wt 169 lb (76.658 kg)  General appearance: alert, no distress, WD/WN, AA male HEENT: normocephalic, sclerae anicteric, TMs pearly, nares patent, slight leftward deviation of septum, moderate turbinate tissue, no discharge or erythema, pharynx normal Oral cavity: MMM, no lesions Neck: supple, no lymphadenopathy, no thyromegaly, no masses Heart: RRR, normal S1, S2, no murmurs Lungs: CTA bilaterally, no wheezes, rhonchi, or rales Ext: no edema Pulses: 2+ symmetric, upper and lower extremities, normal cap refill  PFT in house reviewed.    Assessment: Encounter Diagnoses  Name  Primary?  . Nasal turbinate hypertrophy Yes  . Nasal congestion   . Contact potentially hazardous substances   . Dyspnea      Plan: Nasal congestion and turbinate hypertrophy - referral to ENT  Works in silica environment, dust with concrete blocks.  He notes some dyspnea.  PFT reviewed, will send for CXR.   C/t N95 mask or other PAPR device, consider other type of work if needed.   Discussed health risks with inhalant exposures such as silica.  Ayansh was seen today for nasal congestion.  Diagnoses and all orders for this visit:  Nasal turbinate hypertrophy -     Ambulatory referral to ENT  Nasal congestion -     Ambulatory referral to ENT  Contact potentially hazardous substances -     DG Chest 2 View; Future  Dyspnea -     DG Chest 2 View; Future

## 2016-06-17 NOTE — Addendum Note (Signed)
Addended by: Kieth BrightlyLAWSON, Bishop Vanderwerf M on: 06/17/2016 12:08 PM   Modules accepted: Orders

## 2016-07-06 ENCOUNTER — Telehealth: Payer: Self-pay

## 2016-07-06 NOTE — Telephone Encounter (Signed)
Tried to call pt again about appointment and no answer

## 2016-07-06 NOTE — Telephone Encounter (Signed)
Tried to call pt to let [facility] know [facility] 07/28/16 8:50am with Dr.Byers

## 2016-07-08 NOTE — Telephone Encounter (Signed)
No Answer, sending pt a letter

## 2016-09-25 ENCOUNTER — Emergency Department (HOSPITAL_COMMUNITY): Payer: BLUE CROSS/BLUE SHIELD

## 2016-09-25 ENCOUNTER — Emergency Department (HOSPITAL_COMMUNITY)
Admission: EM | Admit: 2016-09-25 | Discharge: 2016-09-25 | Disposition: A | Discharge status: Home / Self Care | Payer: BLUE CROSS/BLUE SHIELD | Attending: Emergency Medicine | Admitting: Emergency Medicine

## 2016-09-25 ENCOUNTER — Encounter (HOSPITAL_COMMUNITY): Payer: Self-pay | Admitting: Emergency Medicine

## 2016-09-25 DIAGNOSIS — T148XXA Other injury of unspecified body region, initial encounter: Secondary | ICD-10-CM

## 2016-09-25 DIAGNOSIS — Y92410 Unspecified street and highway as the place of occurrence of the external cause: Secondary | ICD-10-CM | POA: Diagnosis not present

## 2016-09-25 DIAGNOSIS — S20211A Contusion of right front wall of thorax, initial encounter: Secondary | ICD-10-CM | POA: Diagnosis not present

## 2016-09-25 DIAGNOSIS — Y939 Activity, unspecified: Secondary | ICD-10-CM | POA: Insufficient documentation

## 2016-09-25 DIAGNOSIS — S4991XA Unspecified injury of right shoulder and upper arm, initial encounter: Secondary | ICD-10-CM | POA: Diagnosis not present

## 2016-09-25 DIAGNOSIS — S40011A Contusion of right shoulder, initial encounter: Secondary | ICD-10-CM | POA: Diagnosis not present

## 2016-09-25 DIAGNOSIS — M25511 Pain in right shoulder: Secondary | ICD-10-CM | POA: Diagnosis not present

## 2016-09-25 DIAGNOSIS — S80812A Abrasion, left lower leg, initial encounter: Secondary | ICD-10-CM | POA: Insufficient documentation

## 2016-09-25 DIAGNOSIS — Y999 Unspecified external cause status: Secondary | ICD-10-CM | POA: Insufficient documentation

## 2016-09-25 MED ORDER — NAPROXEN 500 MG PO TABS: 500.0000 mg | ORAL_TABLET | Freq: Two times a day (BID) | ORAL | 0 refills | Status: AC
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
## 2016-10-19 DIAGNOSIS — S29011A Strain of muscle and tendon of front wall of thorax, initial encounter: Secondary | ICD-10-CM | POA: Diagnosis not present

## 2017-01-11 ENCOUNTER — Encounter: Payer: BLUE CROSS/BLUE SHIELD | Admitting: Medical

## 2017-01-16 ENCOUNTER — Ambulatory Visit (INDEPENDENT_AMBULATORY_CARE_PROVIDER_SITE_OTHER): Payer: BLUE CROSS/BLUE SHIELD | Admitting: Medical

## 2017-01-16 ENCOUNTER — Encounter: Payer: Self-pay | Admitting: Medical

## 2017-01-16 VITALS — BP 128/84 | HR 78 | Ht 69.5 in | Wt 188.8 lb

## 2017-01-16 DIAGNOSIS — Z113 Encounter for screening for infections with a predominantly sexual mode of transmission: Secondary | ICD-10-CM

## 2017-01-16 DIAGNOSIS — Z0000 Encounter for general adult medical examination without abnormal findings: Secondary | ICD-10-CM

## 2017-01-16 DIAGNOSIS — J309 Allergic rhinitis, unspecified: Secondary | ICD-10-CM

## 2017-01-16 LAB — COMPREHENSIVE METABOLIC PANEL
ALBUMIN: 4.4 g/dL (ref 3.6–5.1)
ALT: 23 U/L (ref 9–46)
AST: 24 U/L (ref 10–40)
Alkaline Phosphatase: 63 U/L (ref 40–115)
BUN: 11 mg/dL (ref 7–25)
CALCIUM: 9.4 mg/dL (ref 8.6–10.3)
CO2: 25 mmol/L (ref 20–31)
Chloride: 104 mmol/L (ref 98–110)
Creat: 1.03 mg/dL (ref 0.60–1.35)
Glucose, Bld: 85 mg/dL (ref 65–99)
POTASSIUM: 4 mmol/L (ref 3.5–5.3)
Sodium: 139 mmol/L (ref 135–146)
TOTAL PROTEIN: 7.3 g/dL (ref 6.1–8.1)
Total Bilirubin: 0.6 mg/dL (ref 0.2–1.2)

## 2017-01-16 LAB — CBC
HCT: 41.7 % (ref 38.5–50.0)
HEMOGLOBIN: 14.5 g/dL (ref 13.2–17.1)
MCH: 31.7 pg (ref 27.0–33.0)
MCHC: 34.8 g/dL (ref 32.0–36.0)
MCV: 91.2 fL (ref 80.0–100.0)
MPV: 9.7 fL (ref 7.5–12.5)
Platelets: 360 10*3/uL (ref 140–400)
RBC: 4.57 MIL/uL (ref 4.20–5.80)
RDW: 12.4 % (ref 11.0–15.0)
WBC: 6 10*3/uL (ref 4.0–10.5)

## 2017-01-16 LAB — POCT URINALYSIS DIPSTICK
BILIRUBIN UA: NEGATIVE
Blood, UA: NEGATIVE
Glucose, UA: NEGATIVE
KETONES UA: NEGATIVE
LEUKOCYTES UA: NEGATIVE
Nitrite, UA: NEGATIVE
Protein, UA: NEGATIVE
Spec Grav, UA: 1.02
Urobilinogen, UA: NEGATIVE
pH, UA: 6.5

## 2017-01-16 LAB — HEMOGLOBIN A1C
Hgb A1c MFr Bld: 4.7 % (ref <5.7)
MEAN PLASMA GLUCOSE: 88 mg/dL

## 2017-01-16 LAB — LIPID PANEL
CHOLESTEROL: 178 mg/dL (ref <200)
HDL: 41 mg/dL (ref >40)
LDL CALC: 125 mg/dL — AB (ref <100)
Total CHOL/HDL Ratio: 4.3 Ratio (ref <5.0)
Triglycerides: 62 mg/dL (ref <150)
VLDL: 12 mg/dL (ref <30)

## 2017-01-16 LAB — TSH: TSH: 1.16 m[IU]/L (ref 0.40–4.50)
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
## 2017-01-17 LAB — GC/CHLAMYDIA PROBE AMP
CT PROBE, AMP APTIMA: NOT DETECTED
GC Probe RNA: NOT DETECTED

## 2017-01-17 LAB — RPR

## 2017-01-17 LAB — HIV ANTIBODY (ROUTINE TESTING W REFLEX): HIV: NONREACTIVE

## 2017-01-25 ENCOUNTER — Other Ambulatory Visit: Payer: Self-pay

## 2017-05-17 ENCOUNTER — Telehealth: Payer: Self-pay | Admitting: Medical

## 2017-05-17 NOTE — Telephone Encounter (Signed)
Pt said that he is having trouble breathing out of one side of his noses. He said that the last he was referred he didn't go because he was afraid  To go. Wants to if can be sent back to ent

## 2017-05-17 NOTE — Telephone Encounter (Signed)
Called pt someone p/u but didn't saying heard noise in the back ground.

## 2017-05-17 NOTE — Telephone Encounter (Signed)
Pt called and stated he continues to have nasal issues. Looks like we referred him to ENT back in June 2017. He did not pursue it at that time. Pt would like another referral. Pt was informed that he would need to come back in since it had been so long ago. Pt declined stating he did not want to come back in. Please advise pt at [phone].

## 2017-05-17 NOTE — Telephone Encounter (Signed)
What kind of symptoms is he heaving now?

## 2017-05-18 NOTE — Telephone Encounter (Signed)
Sent referral to [facility] , pt is aware  That I faxed referral

## 2017-05-18 NOTE — Telephone Encounter (Signed)
Refer to ENT

## 2017-06-16 DIAGNOSIS — J343 Hypertrophy of nasal turbinates: Secondary | ICD-10-CM | POA: Diagnosis not present

## 2017-06-16 DIAGNOSIS — J342 Deviated nasal septum: Secondary | ICD-10-CM | POA: Diagnosis not present

## 2017-08-17 DIAGNOSIS — H1032 Unspecified acute conjunctivitis, left eye: Secondary | ICD-10-CM | POA: Diagnosis not present

## 2017-12-01 DIAGNOSIS — J3489 Other specified disorders of nose and nasal sinuses: Secondary | ICD-10-CM | POA: Diagnosis not present

## 2017-12-01 DIAGNOSIS — T444X5A Adverse effect of predominantly alpha-adrenoreceptor agonists, initial encounter: Secondary | ICD-10-CM | POA: Diagnosis not present

## 2017-12-01 DIAGNOSIS — J342 Deviated nasal septum: Secondary | ICD-10-CM | POA: Diagnosis not present

## 2017-12-22 DIAGNOSIS — J3489 Other specified disorders of nose and nasal sinuses: Secondary | ICD-10-CM | POA: Diagnosis not present

## 2017-12-22 DIAGNOSIS — J342 Deviated nasal septum: Secondary | ICD-10-CM | POA: Diagnosis not present

## 2017-12-22 DIAGNOSIS — J301 Allergic rhinitis due to pollen: Secondary | ICD-10-CM | POA: Diagnosis not present

## 2017-12-22 DIAGNOSIS — T444X5A Adverse effect of predominantly alpha-adrenoreceptor agonists, initial encounter: Secondary | ICD-10-CM | POA: Diagnosis not present
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
## 2018-04-20 ENCOUNTER — Encounter: Payer: Self-pay | Admitting: Medical

## 2018-04-20 ENCOUNTER — Ambulatory Visit: Payer: BLUE CROSS/BLUE SHIELD | Admitting: Medical

## 2018-04-20 VITALS — BP 116/80 | HR 85 | Ht 69.5 in | Wt 184.8 lb

## 2018-04-20 DIAGNOSIS — K5900 Constipation, unspecified: Secondary | ICD-10-CM

## 2018-04-20 DIAGNOSIS — K921 Melena: Secondary | ICD-10-CM

## 2018-04-20 NOTE — Progress Notes (Signed)
Subjective: Chief Complaint  Patient presents with  . Constipation    blood in stool yesterday (wipe)    Here for blood in stool and constipation.  Has BM daily, but not normal like he use too.   In the past just had quick push and BM was done.   In recent month has had some problems with constipation.  Has to really force it now, and it will be a long big movement.   didn't have this problem in the past.  Yesterday saw bright red blood on toilet paper.  This was the first time he has every seen blood in stool.  Thinks it was from forcing the bowel movement yesterday  Denies hemorrhoid.  No family hx/o cancer, no family hx/o GI issues.   Drinks some water.   Starts fasting next week for Ramadan.  No other aggravating or relieving factors. No other complaint.   Past Medical History:  Diagnosis Date  . Chronic headache    remote past; resolved as of 2014  . Nearsightedness    wears contact lenses  . Thyroid nodule 2012   biopsy indeterminate   No current outpatient medications on file prior to visit.   No current facility-administered medications on file prior to visit.    ROS as in subjective   Objective: BP 116/80   Pulse 85   Ht 5' 9.5" (1.765 m)   Wt 184 lb 12.8 oz (83.8 kg)   SpO2 97%   BMI 26.90 kg/m   Wt Readings from Last 3 Encounters:  04/20/18 184 lb 12.8 oz (83.8 kg)  01/16/17 188 lb 12.8 oz (85.6 kg)  09/25/16 177 lb 3 oz (80.4 kg)   General appearance: alert, no distress, WD/WN,  Oral cavity: MMM, no lesions Neck: supple, no lymphadenopathy, no thyromegaly, no masses Abdomen: +bs, soft, non tender, non distended, no masses, no hepatomegaly, no splenomegaly   Assessment: Encounter Diagnoses  Name Primary?  . Constipation, unspecified constipation type Yes  . Blood in stool     Plan: Discussed symptoms, concerns.   Discussed recommendations below.  Recommendations  I recommend you drink plenty of water every day such as 64 ounces or more daily  I  recommend you get at least 25 g of fiber in the diet daily such as whole grains, leafy vegetables like lettuce and greens, and fruits such as apples and pears  When you have a hard painful bowel movement, you can use stool softeners over-the-counter for a few days such as docusate sodium  For now consider taking MiraLAX powder over-the-counter 1 capful daily and a beverage.  I would do this for the next 2 weeks to see if this helps her symptoms.  Avoid or limit fast food, processed food, junk food, lots of cheese or meat as these can aggravate the problem  If you have pain at the anus you can use over-the-counter Preparation H for a few days  If you make these changes above but continued to have problems with your bowels with constipation or continue to see blood in the stool then you will need to see a gastroenterologist  Plan to follow-up soon for recheck on this in a month or return for physical in that time frame  Sheridan was seen today for constipation.  Diagnoses and all orders for this visit:  Constipation, unspecified constipation type  Blood in stool

## 2018-04-20 NOTE — Patient Instructions (Addendum)
Recommendations  I recommend you drink plenty of water every day such as 64 ounces or more daily  I recommend you get at least 25 g of fiber in the diet daily such as whole grains, leafy vegetables like lettuce and greens, and fruits such as apples and pears  When you have a hard painful bowel movement, you can use stool softeners over-the-counter for a few days such as docusate sodium  For now consider taking MiraLAX powder over-the-counter 1 capful daily and a beverage.  I would do this for the next 2 weeks to see if this helps her symptoms.  Avoid or limit fast food, processed food, junk food, lots of cheese or meat as these can aggravate the problem  If you have pain at the anus you can use over-the-counter Preparation H for a few days  If you make these changes above but continued to have problems with your bowels with constipation or continue to see blood in the stool then you will need to see a gastroenterologist  Plan to follow-up soon for recheck on this in a month or return for physical in that time frame  Constipation, Adult Constipation is when a person:  Poops (has a bowel movement) fewer times in a week than normal.  Has a hard time pooping.  Has poop that is dry, hard, or bigger than normal.  Follow these instructions at home: Eating and drinking   Eat foods that have a lot of fiber, such as: ? Fresh fruits and vegetables. ? Whole grains. ? Beans.  Eat less of foods that are high in fat, low in fiber, or overly processed, such as: ? [place] fries. ? Hamburgers. ? Cookies. ? Candy. ? Soda.  Drink enough fluid to keep your pee (urine) clear or pale yellow. General instructions  Exercise regularly or as told by your doctor.  Go to the restroom when you feel like you need to poop. Do not hold it in.  Take over-the-counter and prescription medicines only as told by your doctor. These include any fiber supplements.  Do pelvic floor retraining exercises,  such as: ? Doing deep breathing while relaxing your lower belly (abdomen). ? Relaxing your pelvic floor while pooping.  Watch your condition for any changes.  Keep all follow-up visits as told by your doctor. This is important. Contact a doctor if:  You have pain that gets worse.  You have a fever.  You have not pooped for 4 days.  You throw up (vomit).  You are not hungry.  You lose weight.  You are bleeding from the anus.  You have thin, pencil-like poop (stool). Get help right away if:  You have a fever, and your symptoms suddenly get worse.  You leak poop or have blood in your poop.  Your belly feels hard or bigger than normal (is bloated).  You have very bad belly pain.  You feel dizzy or you faint. This information is not intended to replace advice given to you by your health care provider. Make sure you discuss any questions you have with your health care provider. Document Released: 05/23/2008 Document Revised: 06/24/2016 Document Reviewed: 05/25/2016 Elsevier Interactive Patient Education  2018 [employer].

## 2018-05-21 DIAGNOSIS — H1033 Unspecified acute conjunctivitis, bilateral: Secondary | ICD-10-CM | POA: Diagnosis not present

## 2018-05-28 ENCOUNTER — Telehealth: Payer: Self-pay | Admitting: Medical

## 2018-05-28 NOTE — Telephone Encounter (Signed)
Pt called & stated no better, went over instructions from last office visit and pt had only taken Miralax for 3-4 days and I explained he needs to take for 2 weeks and increase fiber etc.  He will try the instructions and call back if no better.

## 2018-09-19 ENCOUNTER — Encounter: Payer: Self-pay | Admitting: Medical

## 2018-09-19 ENCOUNTER — Ambulatory Visit (INDEPENDENT_AMBULATORY_CARE_PROVIDER_SITE_OTHER): Payer: BLUE CROSS/BLUE SHIELD | Admitting: Medical

## 2018-09-19 VITALS — BP 120/76 | HR 94 | Temp 98.3°F | Resp 16 | Ht 69.0 in | Wt 189.0 lb

## 2018-09-19 DIAGNOSIS — K921 Melena: Secondary | ICD-10-CM | POA: Diagnosis not present

## 2018-09-19 DIAGNOSIS — Z2821 Immunization not carried out because of patient refusal: Secondary | ICD-10-CM

## 2018-09-19 DIAGNOSIS — Z0000 Encounter for general adult medical examination without abnormal findings: Secondary | ICD-10-CM | POA: Diagnosis not present

## 2018-09-19 DIAGNOSIS — Z8639 Personal history of other endocrine, nutritional and metabolic disease: Secondary | ICD-10-CM | POA: Diagnosis not present

## 2018-09-19 LAB — POCT URINALYSIS DIP (PROADVANTAGE DEVICE)
BILIRUBIN UA: NEGATIVE
GLUCOSE UA: NEGATIVE mg/dL
Ketones, POC UA: NEGATIVE mg/dL
LEUKOCYTES UA: NEGATIVE
Nitrite, UA: NEGATIVE
PH UA: 5.5 (ref 5.0–8.0)
Protein Ur, POC: NEGATIVE mg/dL
Specific Gravity, Urine: 1.03
UUROB: NEGATIVE
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
## 2018-09-20 LAB — COMPREHENSIVE METABOLIC PANEL
A/G RATIO: 1.9 (ref 1.2–2.2)
ALT: 42 IU/L (ref 0–44)
AST: 33 IU/L (ref 0–40)
Albumin: 4.4 g/dL (ref 3.5–5.5)
Alkaline Phosphatase: 66 IU/L (ref 39–117)
BUN/Creatinine Ratio: 15 (ref 9–20)
BUN: 17 mg/dL (ref 6–20)
Bilirubin Total: 0.5 mg/dL (ref 0.0–1.2)
CALCIUM: 9.2 mg/dL (ref 8.7–10.2)
CHLORIDE: 100 mmol/L (ref 96–106)
CO2: 25 mmol/L (ref 20–29)
Creatinine, Ser: 1.14 mg/dL (ref 0.76–1.27)
GFR calc Af Amer: 96 mL/min/{1.73_m2} (ref >59)
GFR calc non Af Amer: 83 mL/min/{1.73_m2} (ref >59)
GLUCOSE: 84 mg/dL (ref 65–99)
Globulin, Total: 2.3 g/dL (ref 1.5–4.5)
POTASSIUM: 4.2 mmol/L (ref 3.5–5.2)
Sodium: 140 mmol/L (ref 134–144)
Total Protein: 6.7 g/dL (ref 6.0–8.5)

## 2018-09-20 LAB — CBC
HEMATOCRIT: 40.1 % (ref 37.5–51.0)
HEMOGLOBIN: 14.3 g/dL (ref 13.0–17.7)
MCH: 31.9 pg (ref 26.6–33.0)
MCHC: 35.7 g/dL (ref 31.5–35.7)
MCV: 90 fL (ref 79–97)
Platelets: 272 10*3/uL (ref 150–450)
RBC: 4.48 x10E6/uL (ref 4.14–5.80)
RDW: 11.8 % — ABNORMAL LOW (ref 12.3–15.4)
WBC: 7 10*3/uL (ref 3.4–10.8)

## 2018-09-20 LAB — LIPID PANEL
CHOLESTEROL TOTAL: 218 mg/dL — AB (ref 100–199)
Chol/HDL Ratio: 5 ratio (ref 0.0–5.0)
HDL: 44 mg/dL (ref >39)
LDL Calculated: 147 mg/dL — ABNORMAL HIGH (ref 0–99)
TRIGLYCERIDES: 136 mg/dL (ref 0–149)
VLDL Cholesterol Cal: 27 mg/dL (ref 5–40)

## 2018-09-20 LAB — TSH: TSH: 1.32 u[IU]/mL (ref 0.450–4.500)

## 2018-09-25 ENCOUNTER — Other Ambulatory Visit (INDEPENDENT_AMBULATORY_CARE_PROVIDER_SITE_OTHER): Payer: BLUE CROSS/BLUE SHIELD

## 2018-09-25 ENCOUNTER — Other Ambulatory Visit: Payer: BLUE CROSS/BLUE SHIELD

## 2018-09-25 DIAGNOSIS — R319 Hematuria, unspecified: Secondary | ICD-10-CM

## 2018-09-25 LAB — POCT URINALYSIS DIP (PROADVANTAGE DEVICE)
Bilirubin, UA: NEGATIVE
Blood, UA: NEGATIVE
Glucose, UA: NEGATIVE mg/dL
Ketones, POC UA: NEGATIVE mg/dL
LEUKOCYTES UA: NEGATIVE
Nitrite, UA: NEGATIVE
PROTEIN UA: NEGATIVE mg/dL
SPECIFIC GRAVITY, URINE: 1.015
UUROB: NEGATIVE
pH, UA: 6 (ref 5.0–8.0)

## 2018-09-27 ENCOUNTER — Ambulatory Visit
Admission: RE | Admit: 2018-09-27 | Discharge: 2018-09-27 | Disposition: A | Discharge status: Home / Self Care | Payer: Self-pay | Source: Ambulatory Visit | Attending: Medical | Admitting: Medical

## 2018-09-27 ENCOUNTER — Ambulatory Visit (INDEPENDENT_AMBULATORY_CARE_PROVIDER_SITE_OTHER): Payer: BLUE CROSS/BLUE SHIELD | Admitting: Medical

## 2018-09-27 ENCOUNTER — Encounter: Payer: Self-pay | Admitting: Medical

## 2018-09-27 VITALS — BP 128/76 | HR 91 | Temp 98.1°F | Resp 16 | Ht 69.0 in | Wt 189.0 lb

## 2018-09-27 DIAGNOSIS — J029 Acute pharyngitis, unspecified: Secondary | ICD-10-CM | POA: Diagnosis not present

## 2018-09-27 DIAGNOSIS — M79672 Pain in left foot: Secondary | ICD-10-CM

## 2018-09-27 DIAGNOSIS — J310 Chronic rhinitis: Secondary | ICD-10-CM

## 2018-09-27 DIAGNOSIS — M25572 Pain in left ankle and joints of left foot: Secondary | ICD-10-CM

## 2018-09-27 DIAGNOSIS — G8929 Other chronic pain: Secondary | ICD-10-CM

## 2018-09-27 NOTE — Progress Notes (Signed)
Subjective: Chief Complaint  Patient presents with  . left ankle    left ankle pain X 2 day   Here for left ankle pain.   A few years ago had ankle injury, turned foot inward causing injury.   Since then has had intermittent pains in left ankle.    Use to go running, can't do that now.  No hip or knee pain. No recent injury, trauma or fall.     He notes sore throat x 3 days, sneezing, runny nose, some cough.  No fever, no body aches, no chills, using some OTC analgesic.   No sick contacts.  No other aggravating or relieving factors. No other complaint.   Past Medical History:  Diagnosis Date  . Nearsightedness    wears contact lenses  . Thyroid nodule 2012   biopsy indeterminate   No current outpatient medications on file prior to visit.   No current facility-administered medications on file prior to visit.    ROS as in subjective   Objective: BP 128/76   Pulse 91   Temp 98.1 F (36.7 C) (Oral)   Resp 16   Ht 5\' 9"  (1.753 m)   Wt 189 lb (85.7 kg)   SpO2 98%   BMI 27.91 kg/m   General appearance: alert, no distress, WD/WN, AA male HEENT: normocephalic, sclerae anicteric, TMs pearly, nares patent, septal deviation, clear discharge , no erythema, pharynx normal Oral cavity: MMM, no lesions Neck: supple, no lymphadenopathy, no thyromegaly, no masses Lungs: CTA bilaterally, no wheezes, rhonchi, or rales Pulses: 2+ symmetric, upper and lower extremities, normal cap refill Left Foot and ankle nontender, but pain with eversion, less pain with plantar and dorsiflexion, no swelling, no deformity       Assessment: Encounter Diagnoses  Name Primary?  . Chronic pain of left ankle Yes  . Left foot pain   . Sore throat   . Rhinitis, unspecified type       Plan: Chronic ankle and foot pain - likely chronic tendonitis issues.  Will send for xray, consider PT.  Sore throat, rhinitis - viral currently, but has underlying nasal septum issues.  Advised supportive care but  gave contact info for ENT he saw in the past to follow up as surgery was discussed.   He seems ready to pursue this.   Aniceto was seen today for left ankle.  Diagnoses and all orders for this visit:  Chronic pain of left ankle -     DG Foot Complete Left; Future -     DG Ankle Complete Left; Future  Left foot pain -     DG Foot Complete Left; Future -     DG Ankle Complete Left; Future  Sore throat  Rhinitis, unspecified type

## 2018-09-28 ENCOUNTER — Telehealth: Payer: Self-pay | Admitting: Medical

## 2018-09-28 NOTE — Telephone Encounter (Signed)
Please call him back.    Given his xray findings being normal, he should see physical therapy to work on rehabilitating the ankles and feet to get back to normal function.   That was the purpose for PT.   [facility] said he declined when they called him.   I recommend he do this.  Normally it would be 2 times per week for a few weeks then re-assess

## 2018-10-01 ENCOUNTER — Other Ambulatory Visit: Payer: Self-pay

## 2018-10-01 DIAGNOSIS — K921 Melena: Secondary | ICD-10-CM

## 2018-10-01 NOTE — Telephone Encounter (Signed)
Patient notified of recommendations.  Patient states that he feels that PT would be a waste of time.

## 2018-10-02 ENCOUNTER — Encounter: Payer: Self-pay | Admitting: Gastroenterology

## 2018-10-02 NOTE — Telephone Encounter (Signed)
Please let him know that at this point he has a chronic situation that is not improving and the x-ray does not show a fracture.  So I recommend he either see a therapist which can work with him over the next few weeks to improve his function and decrease the pain or we can refer to orthopedic specialist which ever he prefers

## 2018-10-03 NOTE — Telephone Encounter (Signed)
Left message on voicemail for patient to call back. 

## 2018-10-05 NOTE — Telephone Encounter (Signed)
Patient notified

## 2018-10-14 DIAGNOSIS — K5900 Constipation, unspecified: Secondary | ICD-10-CM | POA: Diagnosis not present

## 2018-10-14 DIAGNOSIS — R109 Unspecified abdominal pain: Secondary | ICD-10-CM | POA: Diagnosis not present

## 2018-10-15 ENCOUNTER — Telehealth: Payer: Self-pay

## 2018-10-15 NOTE — Telephone Encounter (Signed)
Left message on voicemail for patient to call back.  He left message with front office for me to call him back.

## 2018-10-31 ENCOUNTER — Ambulatory Visit (INDEPENDENT_AMBULATORY_CARE_PROVIDER_SITE_OTHER): Payer: BLUE CROSS/BLUE SHIELD | Admitting: Gastroenterology

## 2018-10-31 ENCOUNTER — Encounter: Payer: Self-pay | Admitting: Gastroenterology

## 2018-10-31 VITALS — BP 122/86 | HR 112 | Ht 69.0 in | Wt 191.0 lb

## 2018-10-31 DIAGNOSIS — K625 Hemorrhage of anus and rectum: Secondary | ICD-10-CM

## 2018-10-31 DIAGNOSIS — K5909 Other constipation: Secondary | ICD-10-CM

## 2018-10-31 NOTE — Patient Instructions (Addendum)
If you are age 35 or older, your body mass index should be between 23-30. Your Body mass index is 28.21 kg/m. If this is out of the aforementioned range listed, please consider follow up with your Primary Care Provider.  If you are age 35 or younger, your body mass index should be between 19-25. Your Body mass index is 28.21 kg/m. If this is out of the aformentioned range listed, please consider follow up with your Primary Care Provider.   You have been scheduled for a colonoscopy. Please follow written instructions given to you at your visit today.  Please pick up your prep supplies at the pharmacy within the next 1-3 days. If you use inhalers (even only as needed), please bring them with you on the day of your procedure. Your physician has requested that you go to www.startemmi.com and enter the access code given to you at your visit today. This web site gives a general overview about your procedure. However, you should still follow specific instructions given to you by our office regarding your preparation for the procedure.   Constipation:  Increase your intake of water, fruits/vegetables and your activity level.  Ducosate stool softener, 100 mg twice daily. If not improved in 1 week after starting that, please add:  Miralax powder  1/2 to 1 capful in a glass of water or juice once daily.  It was a pleasure to see you today!  Dr. Myrtie Neitheranis
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
## 2018-11-12 ENCOUNTER — Telehealth: Payer: Self-pay | Admitting: Gastroenterology

## 2018-11-12 MED ORDER — NA SULFATE-K SULFATE-MG SULF 17.5-3.13-1.6 GM/177ML PO SOLN: 1.0000 | Freq: Once | ORAL | 0 refills | Status: AC

## 2018-11-12 NOTE — Telephone Encounter (Signed)
Rx sent as requested.

## 2018-11-13 ENCOUNTER — Ambulatory Visit (AMBULATORY_SURGERY_CENTER): Payer: BLUE CROSS/BLUE SHIELD | Admitting: Gastroenterology

## 2018-11-13 ENCOUNTER — Encounter: Payer: BLUE CROSS/BLUE SHIELD | Admitting: Gastroenterology

## 2018-11-13 ENCOUNTER — Encounter: Payer: Self-pay | Admitting: Gastroenterology

## 2018-11-13 VITALS — BP 101/63 | HR 75 | Temp 98.6°F | Resp 9 | Ht 69.0 in | Wt 191.0 lb

## 2018-11-13 DIAGNOSIS — K5909 Other constipation: Secondary | ICD-10-CM

## 2018-11-13 DIAGNOSIS — K625 Hemorrhage of anus and rectum: Secondary | ICD-10-CM

## 2018-11-13 DIAGNOSIS — K621 Rectal polyp: Secondary | ICD-10-CM | POA: Diagnosis not present

## 2018-11-13 DIAGNOSIS — Z1211 Encounter for screening for malignant neoplasm of colon: Secondary | ICD-10-CM | POA: Diagnosis not present

## 2018-11-13 MED ORDER — SODIUM CHLORIDE 0.9 % IV SOLN: 500.0000 mL | Freq: Once | INTRAVENOUS | Status: DC

## 2018-11-13 NOTE — Progress Notes (Signed)
Called to room to assist during endoscopic procedure.  Patient ID and intended procedure confirmed with present staff. Received instructions for my participation in the procedure from the performing physician.  

## 2018-11-13 NOTE — Patient Instructions (Signed)
Handouts Provided:  Polyps  Try using an over the counter daily fiber supplement.  Take Miralax each evening if needed to relieve constipation.  YOU HAD AN ENDOSCOPIC PROCEDURE TODAY AT THE [facility]:   Refer to the procedure report that was given to you for any specific questions about what was found during the examination.  If the procedure report does not answer your questions, please call your gastroenterologist to clarify.  If you requested that your care partner not be given the details of your procedure findings, then the procedure report has been included in a sealed envelope for you to review at your convenience later.  YOU SHOULD EXPECT: Some feelings of bloating in the abdomen. Passage of more gas than usual.  Walking can help get rid of the air that was put into your GI tract during the procedure and reduce the bloating. If you had a lower endoscopy (such as a colonoscopy or flexible sigmoidoscopy) you may notice spotting of blood in your stool or on the toilet paper. If you underwent a bowel prep for your procedure, you may not have a normal bowel movement for a few days.  Please Note:  You might notice some irritation and congestion in your nose or some drainage.  This is from the oxygen used during your procedure.  There is no need for concern and it should clear up in a day or so.  SYMPTOMS TO REPORT IMMEDIATELY:   Following lower endoscopy (colonoscopy or flexible sigmoidoscopy):  Excessive amounts of blood in the stool  Significant tenderness or worsening of abdominal pains  Swelling of the abdomen that is new, acute  Fever of 100F or higher  For urgent or emergent issues, a gastroenterologist can be reached at any hour by calling [phone].   DIET:  We do recommend a small meal at first, but then you may proceed to your regular diet.  Drink plenty of fluids but you should avoid alcoholic beverages for 24 hours.  ACTIVITY:  You should plan to take it  easy for the rest of today and you should NOT DRIVE or use heavy machinery until tomorrow (because of the sedation medicines used during the test).    FOLLOW UP: Our staff will call the number listed on your records the next business day following your procedure to check on you and address any questions or concerns that you may have regarding the information given to you following your procedure. If we do not reach you, we will leave a message.  However, if you are feeling well and you are not experiencing any problems, there is no need to return our call.  We will assume that you have returned to your regular daily activities without incident.  If any biopsies were taken you will be contacted by phone or by letter within the next 1-3 weeks.  Please call us at [phone] if you have not heard about the biopsies in 3 weeks.    SIGNATURES/CONFIDENTIALITY: You and/or your care partner have signed paperwork which will be entered into your electronic medical record.  These signatures attest to the fact that that the information above on your After Visit Summary has been reviewed and is understood.  Full responsibility of the confidentiality of this discharge information lies with you and/or your care-partner.

## 2018-11-13 NOTE — Op Note (Signed)
[facility] Patient Name: Billy Everett Procedure Date: 11/13/2018 8:39 AM MRN: [id number] Endoscopist: Sherilyn Cooter L. Myrtie Neither , MD Age: 37 Referring MD:  Date of Birth: 07-18-1981 Gender: Male Account #: [id number] Procedure:                Colonoscopy Indications:              Rectal bleeding, Constipation Medicines:                Monitored Anesthesia Care Procedure:                Pre-Anesthesia Assessment:                           - Prior to the procedure, a History and Physical                            was performed, and patient medications and                            allergies were reviewed. The patient's tolerance of                            previous anesthesia was also reviewed. The risks                            and benefits of the procedure and the sedation                            options and risks were discussed with the patient.                            All questions were answered, and informed consent                            was obtained. Prior Anticoagulants: The patient has                            taken no previous anticoagulant or antiplatelet                            agents. ASA Grade Assessment: I - A normal, healthy                            patient. After reviewing the risks and benefits,                            the patient was deemed in satisfactory condition to                            undergo the procedure.                           After obtaining informed consent, the colonoscope  was passed under direct vision. Throughout the                            procedure, the patient's blood pressure, pulse, and                            oxygen saturations were monitored continuously. The                            Colonoscope was introduced through the anus and                            advanced to the the terminal ileum, with                            identification of the appendiceal orifice and IC                         valve. The colonoscopy was performed without                            difficulty. The patient tolerated the procedure                            well. The quality of the bowel preparation was                            excellent. The terminal ileum, ileocecal valve,                            appendiceal orifice, and rectum were photographed. Scope In: 8:45:41 AM Scope Out: 8:56:54 AM Scope Withdrawal Time: 0 hours 9 minutes 18 seconds  Total Procedure Duration: 0 hours 11 minutes 13 seconds  Findings:                 The perianal and digital rectal examinations were                            normal.                           The terminal ileum appeared normal.                           A 2 mm polyp was found in the rectum. The polyp was                            sessile. The polyp was removed with a cold biopsy                            forceps. Resection and retrieval were complete.                           No other significant abnormalities were identified  in a careful examination of the remainder of the                            colon. Complications:            No immediate complications. Estimated Blood Loss:     Estimated blood loss was minimal. Impression:               - The examined portion of the ileum was normal.                           - One 2 mm polyp in the rectum, removed with a cold                            biopsy forceps. Resected and retrieved.                           Benign anal bleeding related to constipation. Recommendation:           - Patient has a contact number available for                            emergencies. The signs and symptoms of potential                            delayed complications were discussed with the                            patient. Return to normal activities tomorrow.                            Written discharge instructions were provided to the                             patient.                           - Resume previous diet.                           - Continue present medications.                           - Await pathology results.                           - Repeat colonoscopy is recommended for                            surveillance. The colonoscopy date will be                            determined after pathology results from today's                            exam become available for review.                           -  Return to my office as needed.                           - Daily fiber supplement.                           Add dose of miralax each evening if needed to                            relieve constipation. Henry L. Myrtie Neither, MD 11/13/2018 9:02:01 AM This report has been signed electronically.

## 2018-11-13 NOTE — Progress Notes (Signed)
A and O x3. Report to RN. Tolerated MAC anesthesia well.

## 2018-11-14 ENCOUNTER — Telehealth: Payer: Self-pay | Admitting: *Deleted

## 2018-11-14 NOTE — Telephone Encounter (Signed)
  Follow up Call-  Call back number 11/13/2018  Post procedure Call Back phone  # [phone]  Permission to leave phone message Yes  Some recent data might be hidden     Patient questions:  Do you have a fever, pain , or abdominal swelling? No. Pain Score  0 *  Have you tolerated food without any problems? Yes.    Have you been able to return to your normal activities? Yes.    Do you have any questions about your discharge instructions: Diet   No. Medications  No. Follow up visit  No.  Do you have questions or concerns about your Care? No.  Actions: * If pain score is 4 or above: No action needed, pain <4.

## 2018-11-19 ENCOUNTER — Telehealth: Payer: Self-pay

## 2018-11-19 ENCOUNTER — Telehealth: Payer: Self-pay | Admitting: Gastroenterology

## 2018-11-19 ENCOUNTER — Encounter: Payer: Self-pay | Admitting: Gastroenterology

## 2018-11-19 NOTE — Telephone Encounter (Signed)
Reviewed this AM.  Letter in chart.  Can read it to him if he wants - will be mailed.  Hyperplastic polyp.

## 2018-11-19 NOTE — Telephone Encounter (Signed)
I don't have them, he needs to call GI office

## 2018-11-19 NOTE — Telephone Encounter (Signed)
Please advise on patient's request for pathology results post colonoscopy

## 2018-11-19 NOTE — Telephone Encounter (Signed)
Patient notified

## 2018-11-19 NOTE — Telephone Encounter (Signed)
Left message for patient to call back  

## 2018-11-19 NOTE — Telephone Encounter (Signed)
Patient called wanting to get his colonoscopy results from 11-14-18.

## 2018-11-19 NOTE — Telephone Encounter (Signed)
Pt calling inquiring about path results.  

## 2018-11-22 NOTE — Telephone Encounter (Signed)
I am sending you this because I mailed the letter to the patient, however, he has not contacted the office as of yet. Did not know if this needs to stay in the patient calls to do list;

## 2018-11-27 ENCOUNTER — Telehealth: Payer: Self-pay | Admitting: Gastroenterology

## 2018-11-27 NOTE — Telephone Encounter (Signed)
Pt called in wanting to know his results

## 2018-11-27 NOTE — Telephone Encounter (Signed)
Spoke with pt and results letter reviewed with pt.
